# Patient Record
Sex: Male | Born: 1975 | Race: White | Hispanic: No | Marital: Single | Smoking: Current every day smoker
Health system: Southern US, Community
[De-identification: ages and names within clinical notes are randomized; demographics above are authoritative.]

## PROBLEM LIST (undated history)

## (undated) HISTORY — PX: HERNIA REPAIR: SHX51

## (undated) HISTORY — PX: BACK SURGERY: SHX140

---

## 2007-09-09 ENCOUNTER — Emergency Department: Payer: Self-pay | Admitting: Internal Medicine

## 2009-07-19 IMAGING — CR DG FOOT COMPLETE 3+V*L*
1 series · 3 of 3 positions shown · non-contrast
Comparison: none

REASON FOR EXAM: injury
COMMENTS:   LMP: (Male)

PROCEDURE:     DXR - DXR FOOT LT COMP W/OBLIQUES  - September 09, 2007  [DATE]
RESULT:     No fracture, dislocation or other acute bony abnormality is
identified.

[Series 1: view not recorded · 0.17mm/px · 3 of 3 slices shown]
[im 1/3]
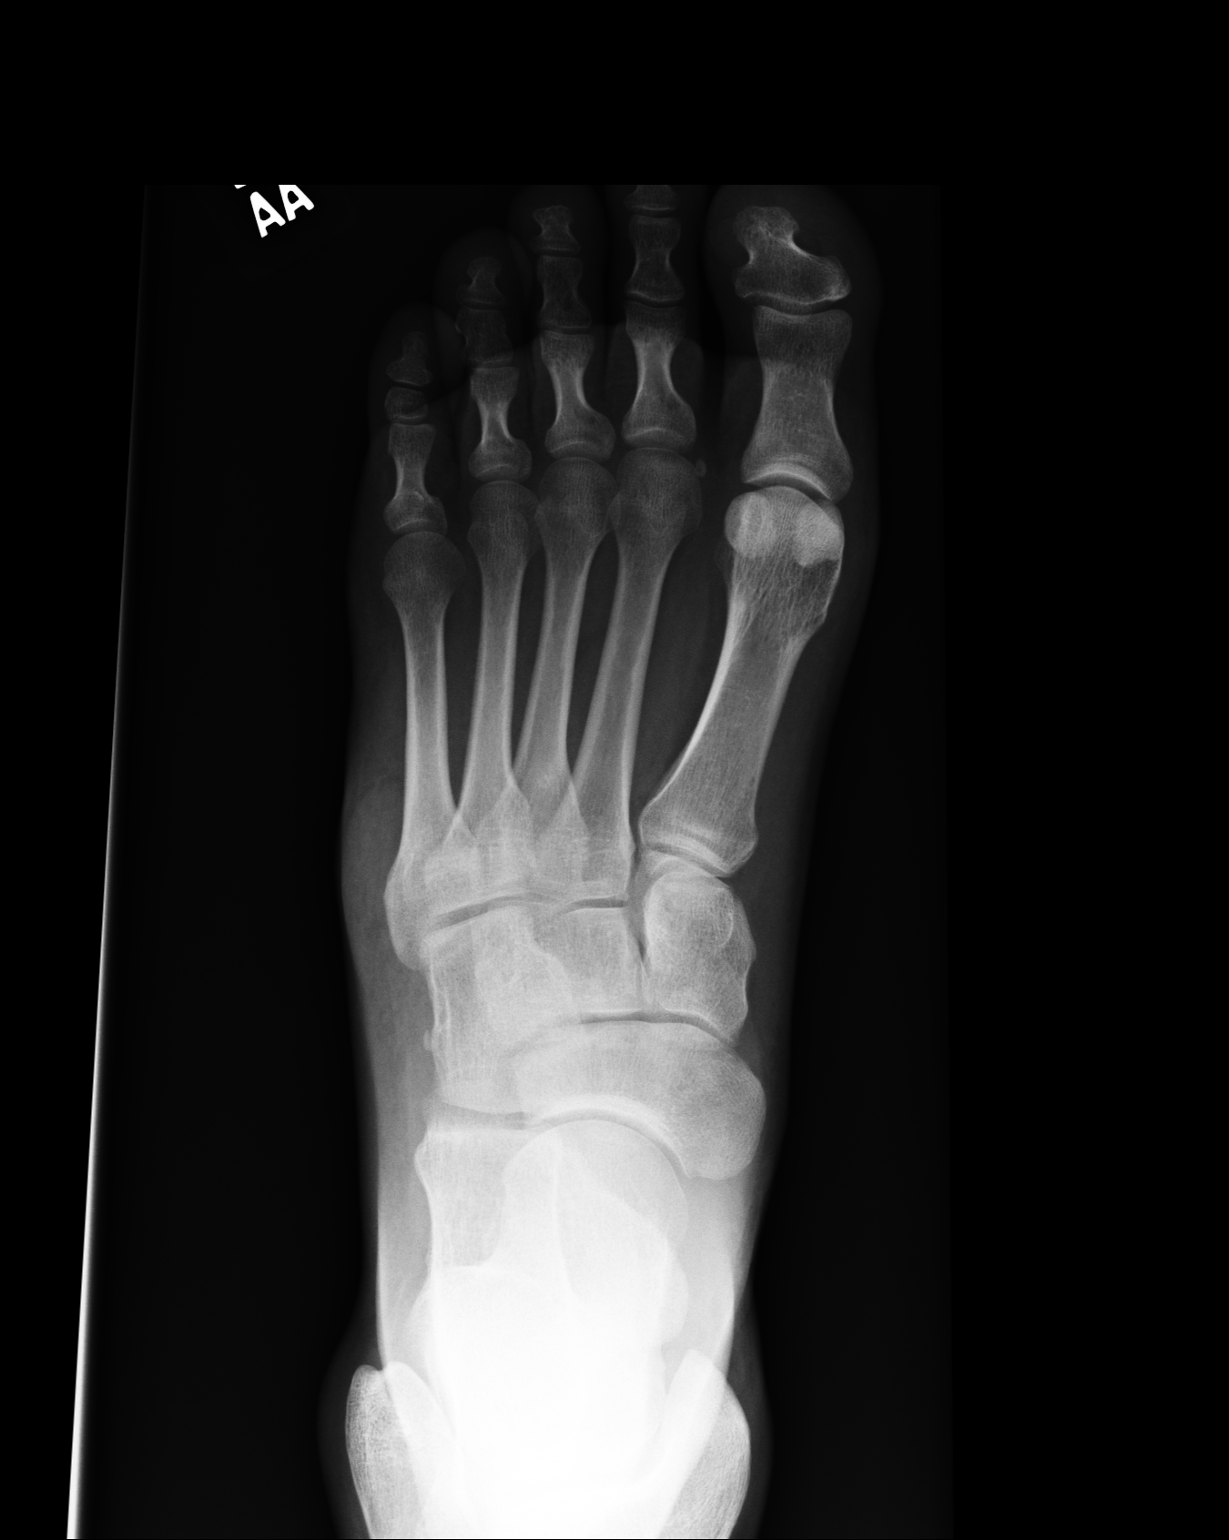
[im 2/3]
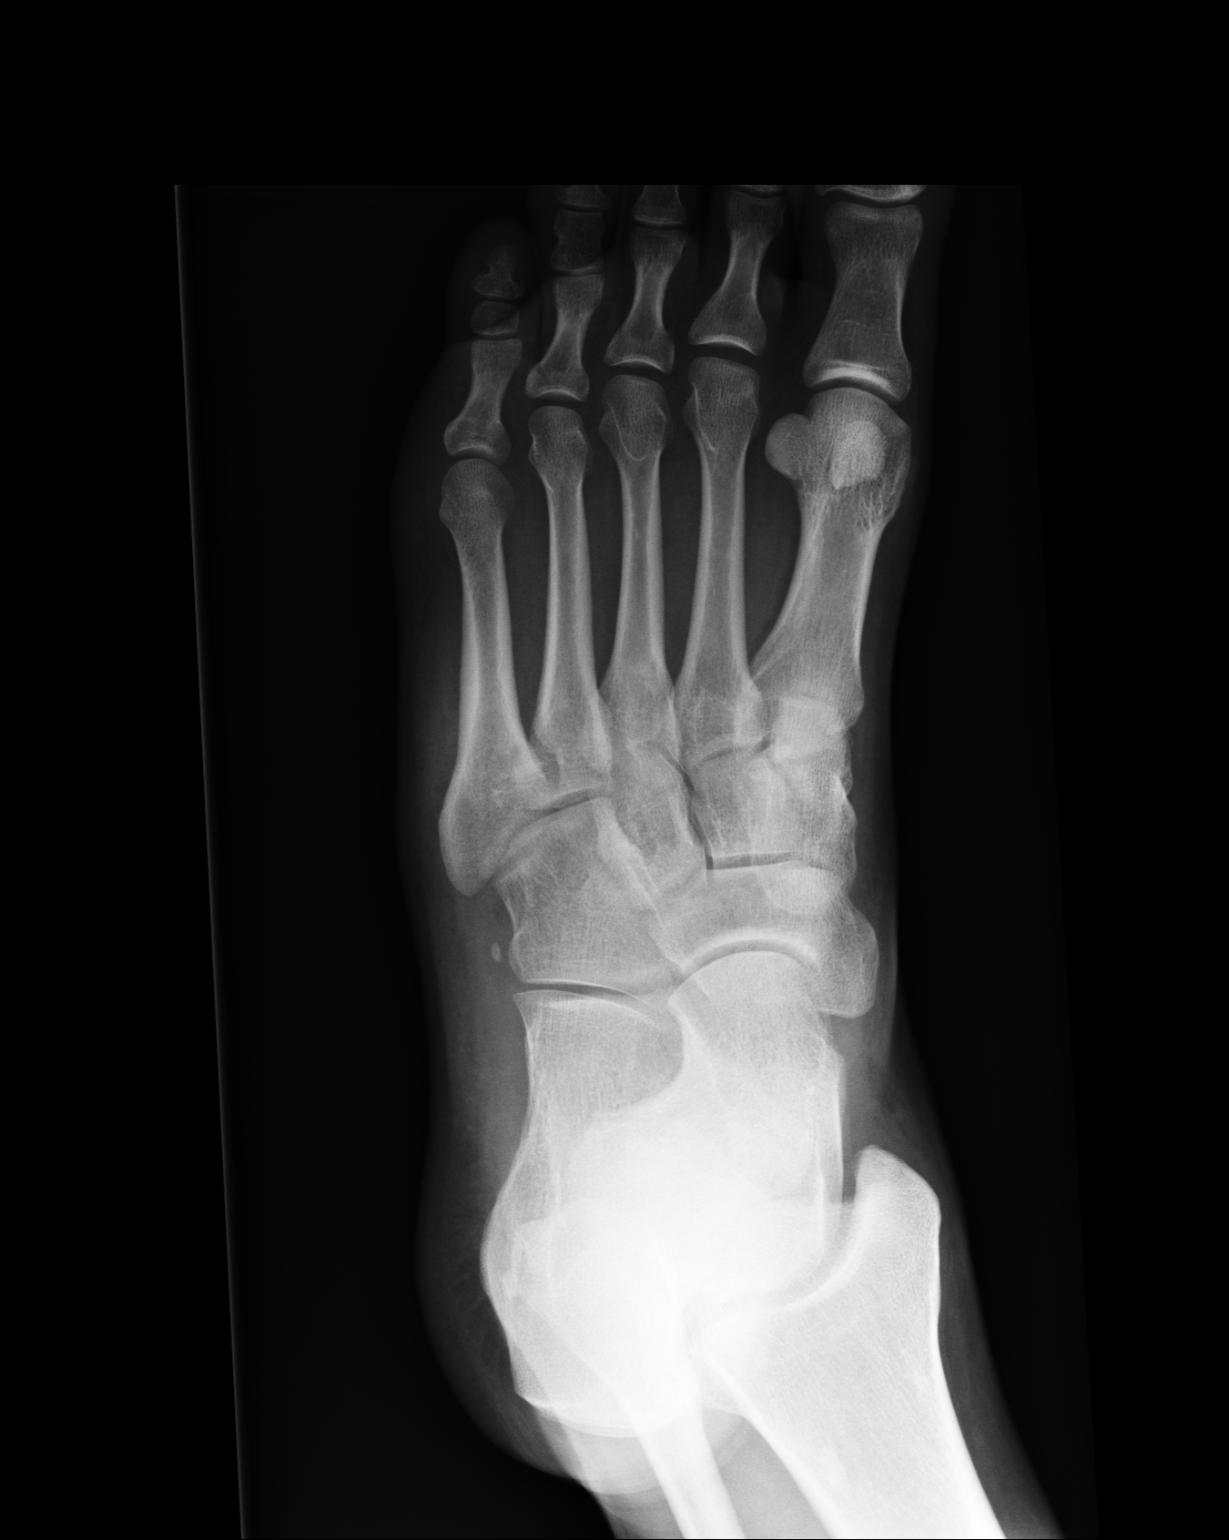
[im 3/3]
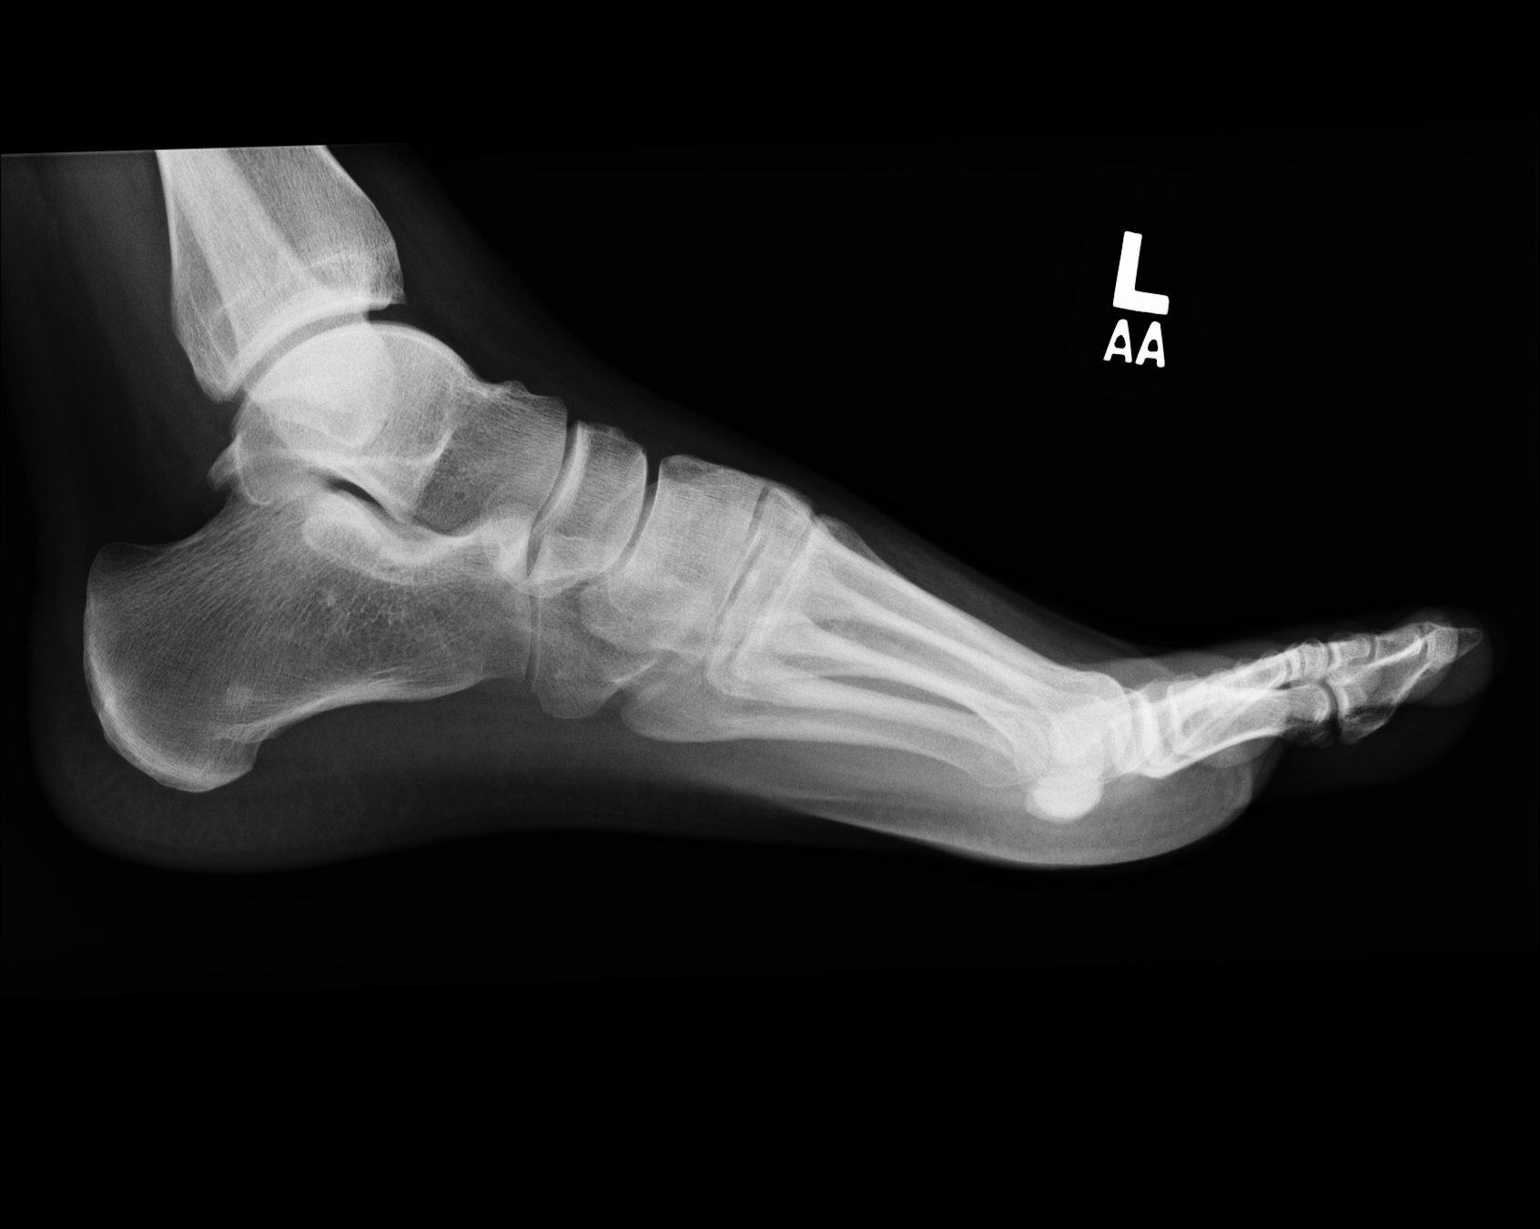

[3 of 3 positions shown; findings below may reference images not displayed]

IMPRESSION: 1.     No significant abnormalities are noted.

## 2014-07-09 ENCOUNTER — Emergency Department
Admit: 2014-07-09 | Disposition: A | Discharge status: Home / Self Care | Payer: Self-pay | Admitting: Emergency Medicine

## 2014-07-09 LAB — CBC WITH DIFFERENTIAL/PLATELET
Basophil #: 0.1 10*3/uL (ref 0.0–0.1)
Basophil %: 0.6 %
Eosinophil #: 0.4 10*3/uL (ref 0.0–0.7)
Eosinophil %: 2.1 %
HCT: 47.3 % (ref 40.0–52.0)
HGB: 16.1 g/dL (ref 13.0–18.0)
Lymphocyte #: 3.1 10*3/uL (ref 1.0–3.6)
Lymphocyte %: 18.1 %
MCH: 32.5 pg (ref 26.0–34.0)
MCHC: 33.9 g/dL (ref 32.0–36.0)
MCV: 96 fL (ref 80–100)
MONOS PCT: 7.4 %
Monocyte #: 1.3 x10 3/mm — ABNORMAL HIGH (ref 0.2–1.0)
NEUTROS ABS: 12.1 10*3/uL — AB (ref 1.4–6.5)
Neutrophil %: 71.8 %
Platelet: 251 10*3/uL (ref 150–440)
RBC: 4.94 10*6/uL (ref 4.40–5.90)
RDW: 13 % (ref 11.5–14.5)
WBC: 16.9 10*3/uL — ABNORMAL HIGH (ref 3.8–10.6)

## 2014-07-09 LAB — URINALYSIS, COMPLETE
BACTERIA: NONE SEEN
Bilirubin,UR: NEGATIVE
Blood: NEGATIVE
GLUCOSE, UR: NEGATIVE mg/dL (ref 0–75)
Ketone: NEGATIVE
Leukocyte Esterase: NEGATIVE
NITRITE: NEGATIVE
Ph: 7 (ref 4.5–8.0)
Protein: NEGATIVE
RBC,UR: NONE SEEN /HPF (ref 0–5)
SPECIFIC GRAVITY: 1.003 (ref 1.003–1.030)
Squamous Epithelial: NONE SEEN

## 2014-07-09 LAB — BASIC METABOLIC PANEL
Anion Gap: 8 (ref 7–16)
BUN: 11 mg/dL
CHLORIDE: 105 mmol/L
CO2: 27 mmol/L
CREATININE: 0.9 mg/dL
Calcium, Total: 9.5 mg/dL
GLUCOSE: 116 mg/dL — AB
Potassium: 3.5 mmol/L
SODIUM: 140 mmol/L

## 2014-07-11 LAB — URINE CULTURE

## 2016-05-18 IMAGING — US US SCROTUM W/ DOPPLER COMPLETE
1 series · 13 of 25 positions shown · non-contrast
Comparison: None.

CLINICAL DATA: 38-year-old male with 18 hr history of left
testicular pain, redness and edema. Of note, patient had a tick on
his left testicle yesterday.

EXAM:
SCROTAL ULTRASOUND
DOPPLER ULTRASOUND OF THE TESTICLES
TECHNIQUE: Complete ultrasound examination of the testicles, epididymis, and
other scrotal structures was performed. Color and spectral Doppler
ultrasound were also utilized to evaluate blood flow to the
testicles.

[Series 1: us scrotum w/ doppler complete · 0.07mm/px · 13 of 131 slices shown]
[im 1/131]
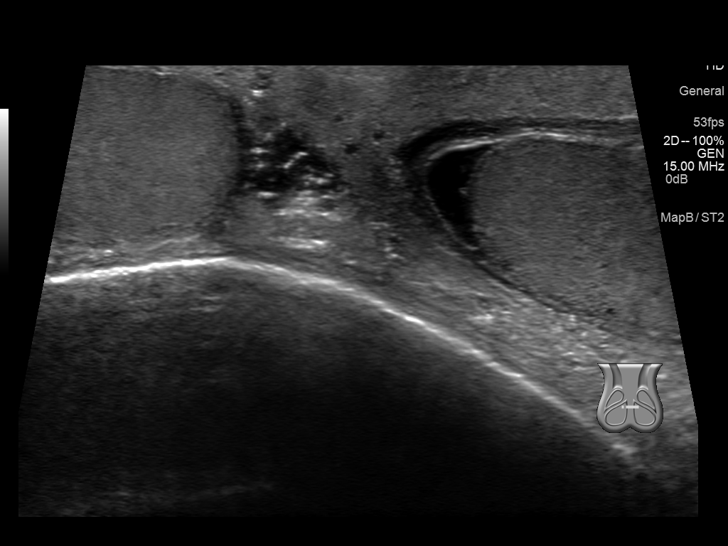
[im 11/131]
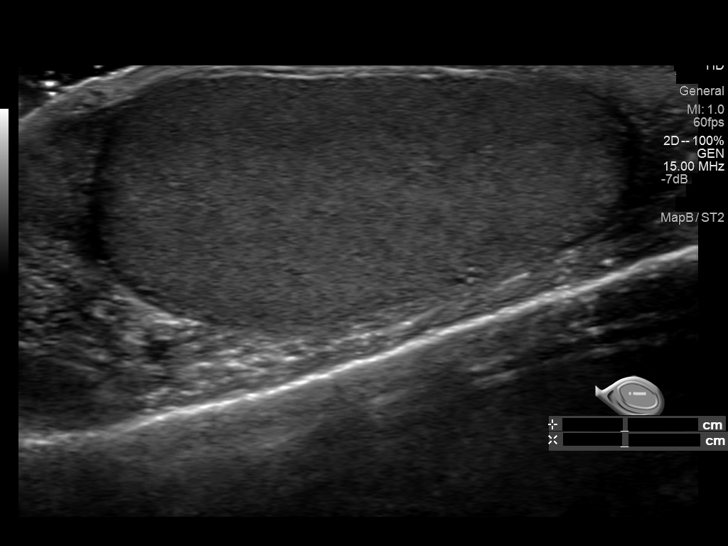
[im 22/131]
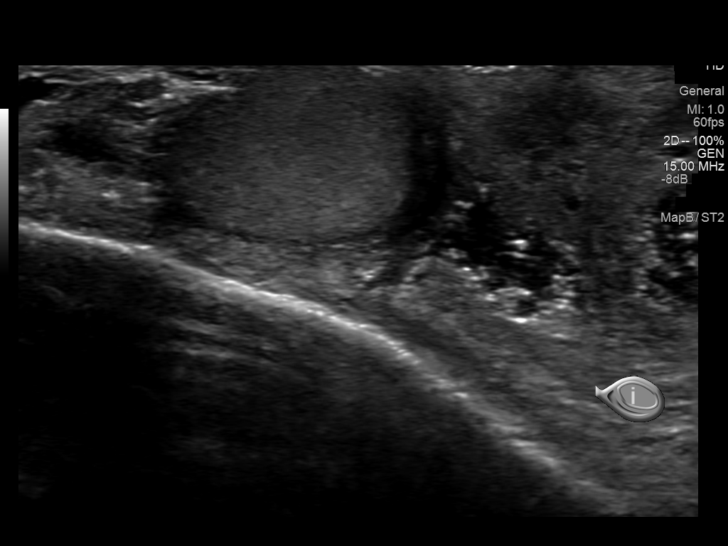
[im 33/131]
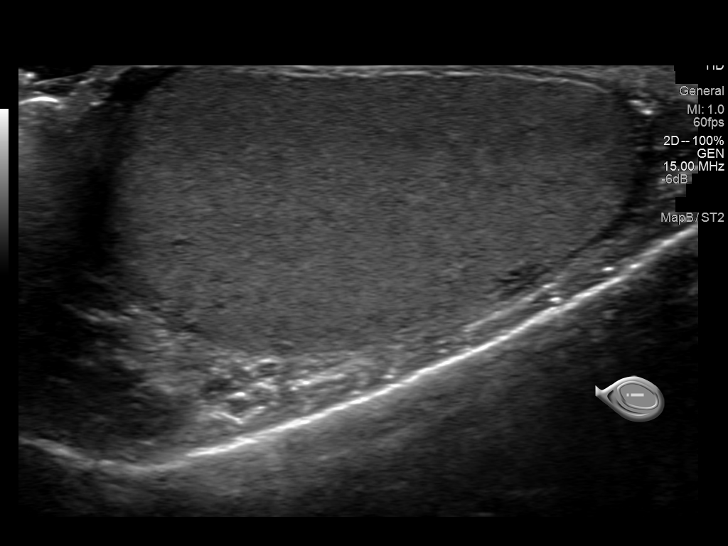
[im 44/131]
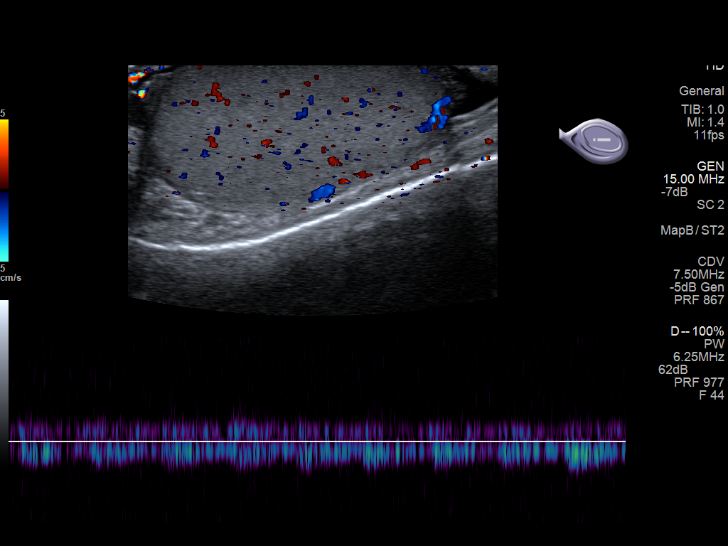
[im 55/131]
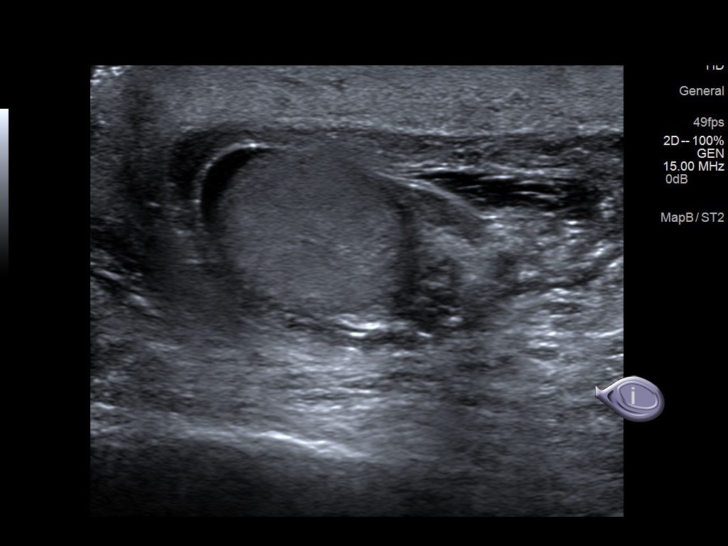
[im 66/131]
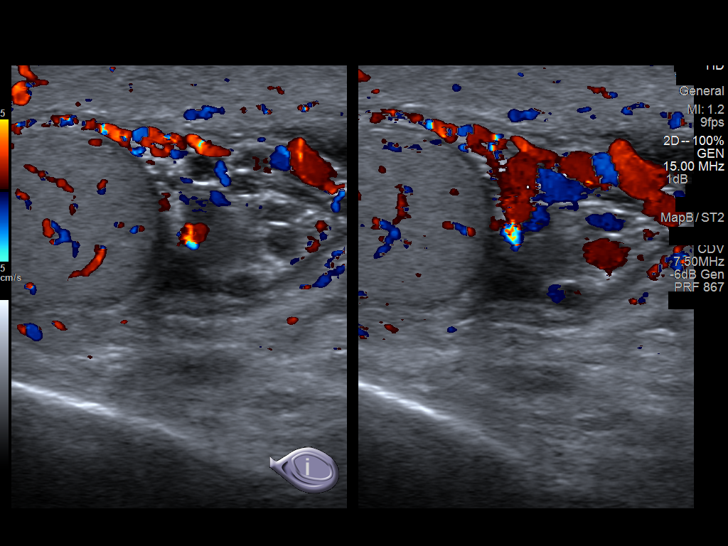
[im 76/131]
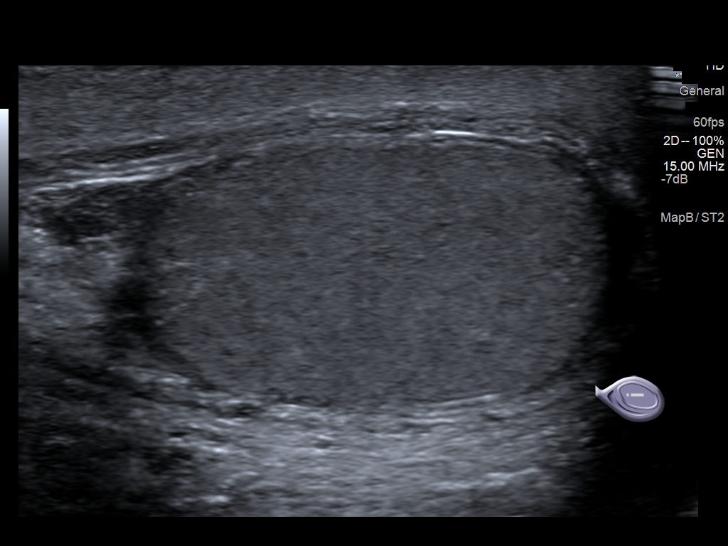
[im 87/131]
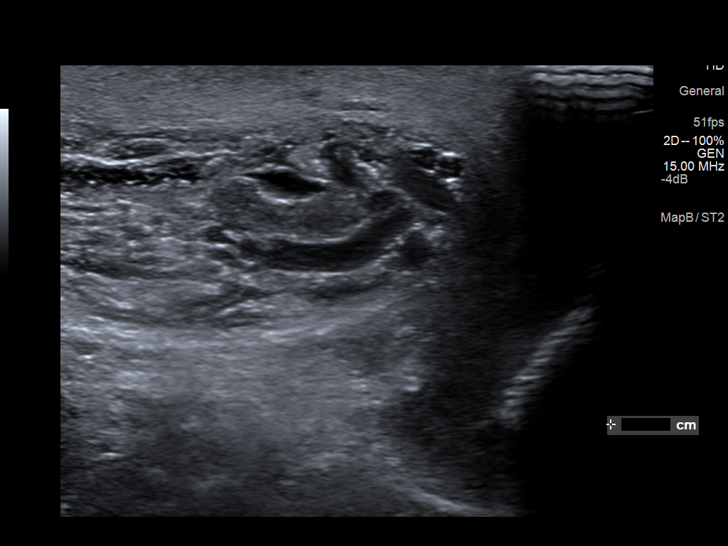
[im 98/131]
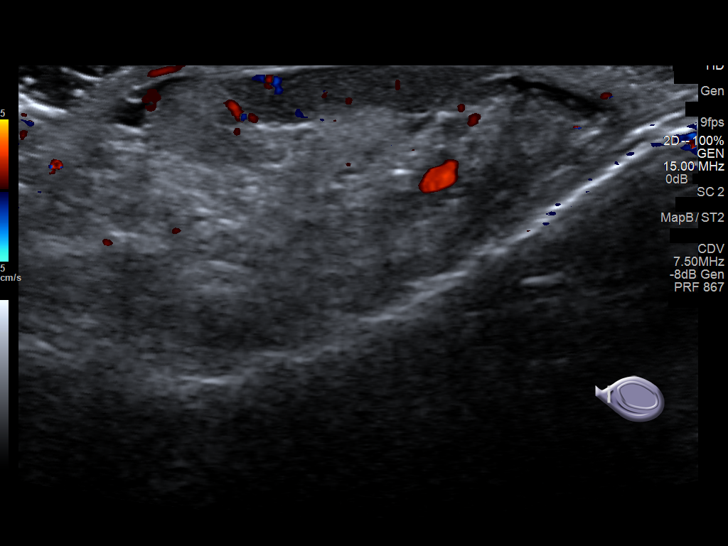
[im 109/131]
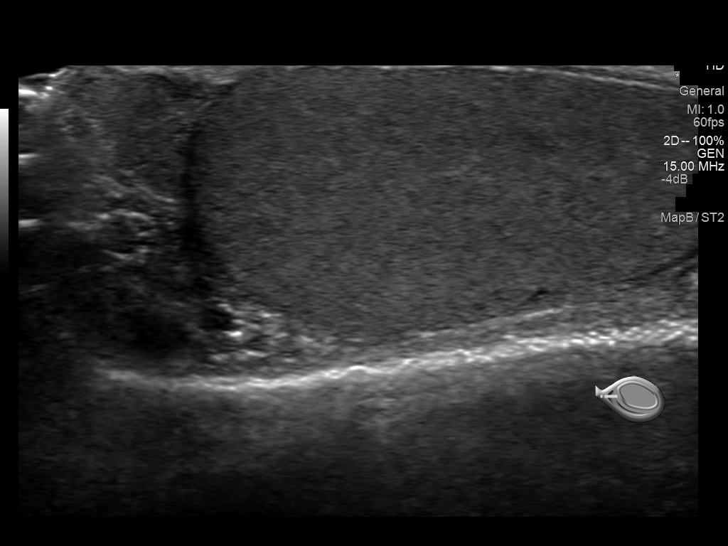
[im 120/131]
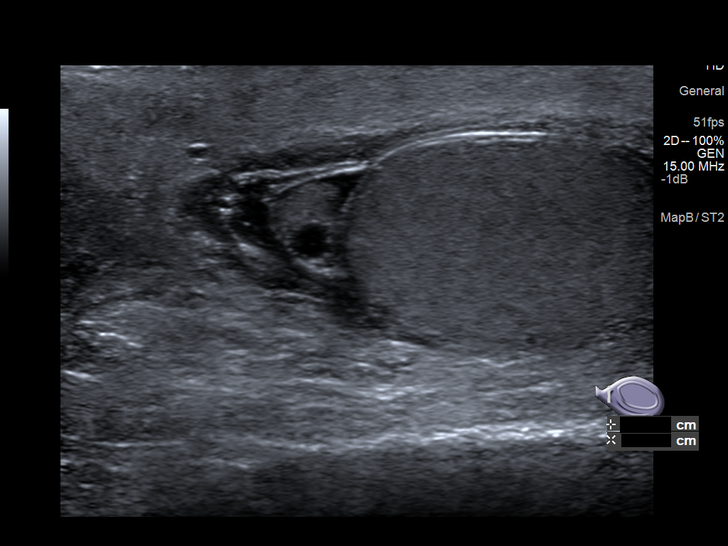
[im 131/131]
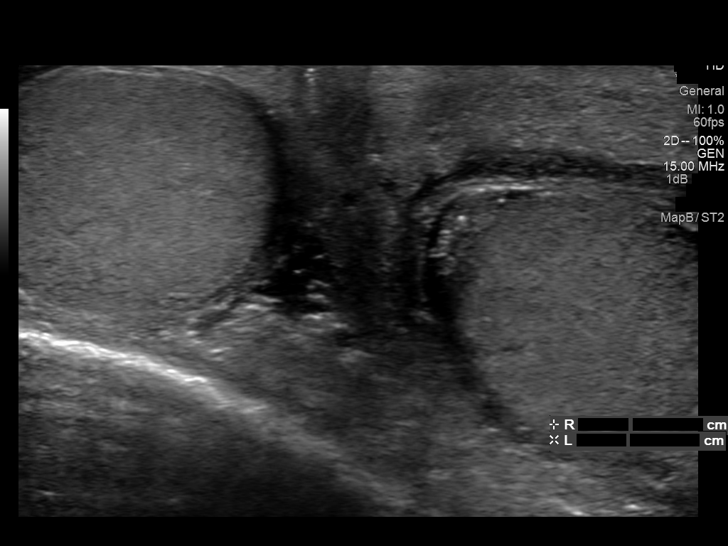

[13 of 25 positions shown; findings below may reference images not displayed]

FINDINGS: Right testicle

Measurements: 3.9 x 1.9 x 2.8 cm. No mass or microlithiasis
visualized.

Left testicle

Measurements: 3.8 x 1.8 x 2.8 cm. No mass or microlithiasis
visualized.

Right epididymis:  4 mm epididymal cyst versus spermatocele.

Left epididymis:  4 mm epididymal cyst versus spermatocele.

Hydrocele:  Small sonographically simple left hydrocele.

Varicocele:  Small left-sided varicocele.

Pulsed Doppler interrogation of both testes demonstrates normal low
resistance arterial and venous waveforms bilaterally.
IMPRESSION: 1. No evidence of testicular torsion.
2. Small and sonographically simple left-sided hydrocele may be
reactive.
3. Small left varicocele.
4. Incidental note is made of bilateral 4 mm epididymal cysts versus
spermatoceles. These benign findings require no additional imaging
followup.

## 2018-11-10 ENCOUNTER — Other Ambulatory Visit: Payer: Self-pay | Admitting: Internal Medicine

## 2018-11-10 DIAGNOSIS — Z20822 Contact with and (suspected) exposure to covid-19: Secondary | ICD-10-CM

## 2018-11-11 LAB — NOVEL CORONAVIRUS, NAA: SARS-CoV-2, NAA: NOT DETECTED

## 2019-12-15 ENCOUNTER — Other Ambulatory Visit: Payer: Self-pay

## 2019-12-15 DIAGNOSIS — Z20822 Contact with and (suspected) exposure to covid-19: Secondary | ICD-10-CM

## 2019-12-16 LAB — NOVEL CORONAVIRUS, NAA: SARS-CoV-2, NAA: NOT DETECTED

## 2019-12-16 LAB — SARS-COV-2, NAA 2 DAY TAT

## 2020-12-02 ENCOUNTER — Other Ambulatory Visit: Payer: Self-pay

## 2020-12-02 DIAGNOSIS — Z28310 Unvaccinated for covid-19: Secondary | ICD-10-CM | POA: Insufficient documentation

## 2020-12-02 DIAGNOSIS — F17210 Nicotine dependence, cigarettes, uncomplicated: Secondary | ICD-10-CM | POA: Insufficient documentation

## 2020-12-02 DIAGNOSIS — U071 COVID-19: Secondary | ICD-10-CM | POA: Insufficient documentation

## 2020-12-02 DIAGNOSIS — R1084 Generalized abdominal pain: Secondary | ICD-10-CM | POA: Insufficient documentation

## 2020-12-02 DIAGNOSIS — Z79899 Other long term (current) drug therapy: Secondary | ICD-10-CM | POA: Insufficient documentation

## 2020-12-02 LAB — COMPREHENSIVE METABOLIC PANEL
ALT: 26 U/L (ref 0–44)
AST: 40 U/L (ref 15–41)
Albumin: 4.2 g/dL (ref 3.5–5.0)
Alkaline Phosphatase: 88 U/L (ref 38–126)
Anion gap: 11 (ref 5–15)
BUN: 19 mg/dL (ref 6–20)
CO2: 24 mmol/L (ref 22–32)
Calcium: 9.3 mg/dL (ref 8.9–10.3)
Chloride: 98 mmol/L (ref 98–111)
Creatinine, Ser: 0.96 mg/dL (ref 0.61–1.24)
GFR, Estimated: >60 mL/min (ref >60)
Glucose, Bld: 134 mg/dL — ABNORMAL HIGH (ref 70–99)
Potassium: 3.3 mmol/L — ABNORMAL LOW (ref 3.5–5.1)
Sodium: 133 mmol/L — ABNORMAL LOW (ref 135–145)
Total Bilirubin: 0.6 mg/dL (ref 0.3–1.2)
Total Protein: 7.2 g/dL (ref 6.5–8.1)

## 2020-12-02 LAB — LACTIC ACID, PLASMA
Lactic Acid, Venous: 2.7 mmol/L (ref 0.5–1.9)
Lactic Acid, Venous: 2.4 mmol/L (ref 0.5–1.9)

## 2020-12-02 LAB — CBC WITH DIFFERENTIAL/PLATELET
Abs Immature Granulocytes: 0.02 10*3/uL (ref 0.00–0.07)
Basophils Absolute: 0 10*3/uL (ref 0.0–0.1)
Basophils Relative: 0 %
Eosinophils Absolute: 0 10*3/uL (ref 0.0–0.5)
Eosinophils Relative: 0 %
HCT: 47.8 % (ref 39.0–52.0)
Hemoglobin: 17 g/dL (ref 13.0–17.0)
Immature Granulocytes: 0 %
Lymphocytes Relative: 22 %
Lymphs Abs: 1.2 10*3/uL (ref 0.7–4.0)
MCH: 32.6 pg (ref 26.0–34.0)
MCHC: 35.6 g/dL (ref 30.0–36.0)
MCV: 91.6 fL (ref 80.0–100.0)
Monocytes Absolute: 0.4 10*3/uL (ref 0.1–1.0)
Monocytes Relative: 7 %
Neutro Abs: 3.8 10*3/uL (ref 1.7–7.7)
Neutrophils Relative %: 71 %
Platelets: 188 10*3/uL (ref 150–400)
RBC: 5.22 MIL/uL (ref 4.22–5.81)
RDW: 13 % (ref 11.5–15.5)
WBC: 5.3 10*3/uL (ref 4.0–10.5)
nRBC: 0 % (ref 0.0–0.2)

## 2020-12-02 LAB — CK: Total CK: 91 U/L (ref 49–397)

## 2020-12-02 LAB — ETHANOL: Alcohol, Ethyl (B): <10 mg/dL (ref <10)

## 2020-12-02 NOTE — ED Provider Notes (Signed)
Emergency Medicine Provider Triage Evaluation Note  Maurice Brown , a 45 y.o. male  was evaluated in triage.  Pt complains of "infection in my brain," left shoulder pain, back pain, and abdominal pain. Symptoms started Friday. Reports he hasn't eaten in 3 days and laid in the floor from Friday until today.  Review of Systems  Positive: Diarrhea, musculoskeletal pain Negative: Headache, vomiting, focal weakness.  Physical Exam  BP 115/78 (BP Location: Right Arm)   Pulse (!) 127   Temp (!) 100.9 F (38.3 C) (Oral)   Resp 14   SpO2 96%  Gen:   Awake, no distress   Resp:  Normal effort  MSK:   Moves extremities without difficulty  Other:  Slow, but clear speech. No focal weakness  Medical Decision Making  Medically screening exam initiated at 7:21 PM.  Appropriate orders placed.  Maurice Brown was informed that the remainder of the evaluation will be completed by another provider, this initial triage assessment does not replace that evaluation, and the importance of remaining in the ED until their evaluation is complete.   Chinita Pester, FNP 12/02/20 1925    Gilles Chiquito, MD 12/02/20 1944

## 2020-12-02 NOTE — ED Notes (Signed)
COVID SWAB SENT TO LAB

## 2020-12-02 NOTE — ED Notes (Signed)
Ex blue in lab

## 2020-12-02 NOTE — ED Triage Notes (Addendum)
Pt states 'i woke up fri and couldn't move, I had back pain (broken 68yrs ago with surgery), my L shoulder hurt (dislocated 54mo ago-repaired), and my R groin hurt (inguinal hernia repair at 45yo). I also havent eaten and have had diarrhea x 3 days, I was hospitalized in [place] x1day for shingles and put on acyclovir last month.  I know I have an infection in my brain.'  Pt ambulatory in no apparent distress.

## 2020-12-03 ENCOUNTER — Emergency Department
Admission: EM | Admit: 2020-12-03 | Discharge: 2020-12-03 | Disposition: A | Discharge status: Home / Self Care | Payer: Self-pay | Attending: Emergency Medicine | Admitting: Emergency Medicine

## 2020-12-03 ENCOUNTER — Emergency Department: Payer: Self-pay

## 2020-12-03 ENCOUNTER — Encounter: Payer: Self-pay | Admitting: Emergency Medicine

## 2020-12-03 DIAGNOSIS — U071 COVID-19: Secondary | ICD-10-CM

## 2020-12-03 LAB — URINALYSIS, COMPLETE (UACMP) WITH MICROSCOPIC
Bacteria, UA: NONE SEEN
Bilirubin Urine: NEGATIVE
Glucose, UA: NEGATIVE mg/dL
Hgb urine dipstick: NEGATIVE
Ketones, ur: NEGATIVE mg/dL
Leukocytes,Ua: NEGATIVE
Nitrite: NEGATIVE
Protein, ur: NEGATIVE mg/dL
Specific Gravity, Urine: <1.005 — ABNORMAL LOW (ref 1.005–1.030)
Squamous Epithelial / LPF: NONE SEEN (ref 0–5)
pH: 6 (ref 5.0–8.0)

## 2020-12-03 LAB — URINE DRUG SCREEN, QUALITATIVE (ARMC ONLY)
Amphetamines, Ur Screen: POSITIVE — AB
Barbiturates, Ur Screen: NOT DETECTED
Benzodiazepine, Ur Scrn: NOT DETECTED
Cannabinoid 50 Ng, Ur ~~LOC~~: NOT DETECTED
Cocaine Metabolite,Ur ~~LOC~~: NOT DETECTED
MDMA (Ecstasy)Ur Screen: NOT DETECTED
Methadone Scn, Ur: NOT DETECTED
Opiate, Ur Screen: NOT DETECTED
Phencyclidine (PCP) Ur S: NOT DETECTED
Tricyclic, Ur Screen: NOT DETECTED

## 2020-12-03 LAB — LACTIC ACID, PLASMA: Lactic Acid, Venous: 1.7 mmol/L (ref 0.5–1.9)

## 2020-12-03 LAB — RESP PANEL BY RT-PCR (FLU A&B, COVID) ARPGX2
Influenza A by PCR: NEGATIVE
Influenza B by PCR: NEGATIVE
SARS Coronavirus 2 by RT PCR: POSITIVE — AB

## 2020-12-03 LAB — TROPONIN I (HIGH SENSITIVITY): Troponin I (High Sensitivity): 8 ng/L (ref <18)

## 2020-12-03 LAB — PROCALCITONIN: Procalcitonin: <0.1 ng/mL

## 2020-12-03 MED ORDER — IOHEXOL 350 MG/ML SOLN
75.0000 mL | Freq: Once | INTRAVENOUS | Status: AC | PRN
  Administered 2020-12-03: 75 mL via INTRAVENOUS

## 2020-12-03 MED ORDER — ACETAMINOPHEN 500 MG PO TABS
1000.0000 mg | ORAL_TABLET | Freq: Once | ORAL | Status: AC
Start: 2020-12-03 — End: 2020-12-03
  Administered 2020-12-03: 1000 mg via ORAL
  Filled 2020-12-03: qty 2

## 2020-12-03 MED ORDER — KETOROLAC TROMETHAMINE 30 MG/ML IJ SOLN
30.0000 mg | Freq: Once | INTRAMUSCULAR | Status: AC
  Administered 2020-12-03: 30 mg via INTRAVENOUS
  Filled 2020-12-03: qty 1

## 2020-12-03 MED ORDER — LACTATED RINGERS IV BOLUS
2500.0000 mL | Freq: Once | INTRAVENOUS | Status: AC
  Administered 2020-12-03: 2500 mL via INTRAVENOUS

## 2020-12-03 MED ORDER — METOCLOPRAMIDE HCL 5 MG/ML IJ SOLN
10.0000 mg | Freq: Once | INTRAMUSCULAR | Status: DC
  Filled 2020-12-03: qty 2

## 2020-12-03 MED ORDER — NIRMATRELVIR/RITONAVIR (PAXLOVID)TABLET: 3.0000 | ORAL_TABLET | Freq: Two times a day (BID) | ORAL | 0 refills | Status: AC

## 2020-12-03 MED ORDER — DIPHENHYDRAMINE HCL 50 MG/ML IJ SOLN
25.0000 mg | Freq: Once | INTRAMUSCULAR | Status: DC
  Filled 2020-12-03: qty 1

## 2020-12-03 NOTE — Discharge Instructions (Addendum)
You may alternate Tylenol 1000 mg every 6 hours as needed for pain, fever and Ibuprofen 800 mg every 8 hours as needed for pain, fever.  Please take Ibuprofen with food.  Do not take more than 4000 mg of Tylenol (acetaminophen) in a 24 hour period.  These medications are found over-the-counter.   Steps to find a Primary Care Provider (PCP):  Call [phone] or [phone] to access "[facility] Find a Doctor Service."  2.  You may also go on the [facility] website at [email or web address]  

## 2020-12-03 NOTE — ED Notes (Signed)
This RN called for patient in the lobby multiple times with no answer, called patient's cell phone several times with no response.

## 2020-12-03 NOTE — ED Provider Notes (Signed)
[facility] Emergency Department Provider Note  ____________________________________________   Event Date/Time   First MD Initiated Contact with Patient 12/03/20 0116     (approximate)  I have reviewed the triage vital signs and the nursing notes.   HISTORY  Chief Complaint Weakness    HPI Maurice Brown is a 45 y.o. male with no significant past medical history who presents to the emergency department with complaints of throbbing headache, fever, body aches, diarrhea, abdominal pain, shortness of breath started Friday, September 16.  Patient has not vaccinated against COVID-19.  No known sick contacts or recent travel.  No neck pain or neck stiffness.  No nausea or vomiting.  No dysuria or hematuria.  No rash.  States he is having a severe headache and is concerned that he has "infection in my brain".  He has not taken any medications prior to arrival.  States he has felt so bad he has been laying on the bathroom floor for several days as he felt "paralyzed" and could not get up because he was feeling so poorly.  States he has not been eating and drinking.        History reviewed. No pertinent past medical history.  There are no problems to display for this patient.   Past Surgical History:  Procedure Laterality Date   BACK SURGERY     HERNIA REPAIR      Prior to Admission medications   Medication Sig Start Date End Date Taking? Authorizing Provider  acyclovir (ZOVIRAX) 400 MG tablet Take 400 mg by mouth 5 (five) times daily. Filled 10/30/2020   Yes [provider]  nirmatrelvir/ritonavir EUA (PAXLOVID) 20 x 150 MG & 10 x 100MG  TABS Take 3 tablets by mouth 2 (two) times daily for 5 days. Patient GFR is 60. Take nirmatrelvir (150 mg) two tablets twice daily for 5 days and ritonavir (100 mg) one tablet twice daily for 5 days. 12/03/20 12/08/20 Yes Nickoles Gregori, 12/10/20, DO    Allergies Patient has no known allergies.  History reviewed. No pertinent  family history.  Social History Social History   Tobacco Use   Smoking status: Every Day    Types: Cigarettes   Smokeless tobacco: Never  Substance Use Topics   Alcohol use: Not Currently   Drug use: Not Currently    Comment: hx marijuana use    Review of Systems Constitutional: + fever. Eyes: No visual changes. ENT: No sore throat. Cardiovascular: Denies chest pain. Respiratory: + shortness of breath. Gastrointestinal: No nausea, vomiting.  + diarrhea. Genitourinary: Negative for dysuria. Musculoskeletal: Negative for back pain. Skin: Negative for rash. Neurological: Negative for focal weakness or numbness.  ____________________________________________   PHYSICAL EXAM:  VITAL SIGNS: ED Triage Vitals  Enc Vitals Group     BP 12/02/20 1915 115/78     Pulse Rate 12/02/20 1915 (!) 127     Resp 12/02/20 1915 14     Temp 12/02/20 1915 (!) 100.9 F (38.3 C)     Temp Source 12/02/20 1915 Oral     SpO2 12/02/20 1915 96 %     Weight 12/02/20 1922 165 lb (74.8 kg)     Height 12/02/20 1922 5\' 10"  (1.778 m)     Head Circumference --      Peak Flow --      Pain Score 12/02/20 1922 10     Pain Loc --      Pain Edu? --      Excl. in GC? --  CONSTITUTIONAL: Alert and oriented and responds appropriately to questions.  Disheveled, chronically ill-appearing, appears older than stated age, nontoxic HEAD: Normocephalic, atraumatic EYES: Conjunctivae clear, pupils appear equal, EOM appear intact ENT: normal nose; moist mucous membranes NECK: Supple, normal ROM, no meningismus CARD: RRR; S1 and S2 appreciated; no murmurs, no clicks, no rubs, no gallops RESP: Normal chest excursion without splinting or tachypnea; breath sounds clear and equal bilaterally; no wheezes, no rhonchi, no rales, no hypoxia or respiratory distress, speaking full sentences ABD/GI: Normal bowel sounds; non-distended; soft, diffusely tender throughout the abdomen with voluntary guarding, no rebound BACK:  The back appears normal, no CVA tenderness EXT: Normal ROM in all joints; no deformity noted, no edema; no cyanosis SKIN: Normal color for age and race; warm; no rash on exposed skin NEURO: Moves all extremities equally, ambulates with normal gait, normal speech PSYCH: The patient's mood and manner are appropriate.  ____________________________________________   LABS (all labs ordered are listed, but only abnormal results are displayed)  Labs Reviewed  RESP PANEL BY RT-PCR (FLU A&B, COVID) ARPGX2 - Abnormal; Notable for the following components:      Result Value   SARS Coronavirus 2 by RT PCR POSITIVE (*)    All other components within normal limits  COMPREHENSIVE METABOLIC PANEL - Abnormal; Notable for the following components:   Sodium 133 (*)    Potassium 3.3 (*)    Glucose, Bld 134 (*)    All other components within normal limits  URINALYSIS, COMPLETE (UACMP) WITH MICROSCOPIC - Abnormal; Notable for the following components:   Color, Urine STRAW (*)    Specific Gravity, Urine <1.005 (*)    All other components within normal limits  LACTIC ACID, PLASMA - Abnormal; Notable for the following components:   Lactic Acid, Venous 2.7 (*)    All other components within normal limits  LACTIC ACID, PLASMA - Abnormal; Notable for the following components:   Lactic Acid, Venous 2.4 (*)    All other components within normal limits  URINE DRUG SCREEN, QUALITATIVE ([facility] ONLY) - Abnormal; Notable for the following components:   Amphetamines, Ur Screen POSITIVE (*)    All other components within normal limits  CULTURE, BLOOD (SINGLE)  CULTURE, BLOOD (SINGLE)  URINE CULTURE  CBC WITH DIFFERENTIAL/PLATELET  CK  ETHANOL  PROCALCITONIN  LACTIC ACID, PLASMA  TROPONIN I (HIGH SENSITIVITY)   ____________________________________________  EKG   EKG Interpretation  Date/Time:  Monday December 03 2020 01:51:50 EDT Ventricular Rate:  71 PR Interval:  148 QRS Duration: 82 QT  Interval:  382 QTC Calculation: 416 R Axis:   84 Text Interpretation: Sinus rhythm Probable anteroseptal infarct, old ST elevation, consider inferior injury Confirmed by Rochele Raring [phone]) on 12/03/2020 2:05:49 AM        ____________________________________________  RADIOLOGY Normajean Baxter Quintasia Theroux, personally viewed and evaluated these images (plain radiographs) as part of my medical decision making, as well as reviewing the written report by the radiologist.  ED MD interpretation: CT head shows no acute abnormality.  CT of the abdomen pelvis shows no acute abnormality.  Appendix is normal.  Chest x-ray clear.  Official radiology report(s): CT HEAD WO CONTRAST ( )  Result Date: 12/03/2020 CLINICAL DATA:  Headache, intracranial hemorrhage suspected EXAM: CT HEAD WITHOUT CONTRAST TECHNIQUE: Contiguous axial images were obtained from the base of the skull through the vertex without intravenous contrast. COMPARISON:  None. FINDINGS: Brain: No evidence of acute infarction, hemorrhage, cerebral edema, mass, mass effect, or midline shift. Ventricles and sulci are  normal for age. No extra-axial fluid collection. Vascular: No hyperdense vessel or unexpected calcification. Skull: Normal. Negative for fracture or focal lesion. Sinuses/Orbits: Mucosal thickening in the ethmoid air cells. The orbits are unremarkable. Other: The mastoid air cells are well aerated. IMPRESSION: No acute intracranial process. No etiology is seen for the patient's headache. Electronically Signed   By: Wiliam Ke M.D.   On: 12/03/2020 02:43   CT ABDOMEN PELVIS W CONTRAST  Result Date: 12/03/2020 CLINICAL DATA:  Abdominal pain, acute, non localized. EXAM: CT ABDOMEN AND PELVIS WITH CONTRAST TECHNIQUE: Multidetector CT imaging of the abdomen and pelvis was performed using the standard protocol following bolus administration of intravenous contrast. CONTRAST:  49mL OMNIPAQUE IOHEXOL 350 MG/ML SOLN COMPARISON:  None. FINDINGS:  Lower chest: Bibasilar atelectasis.  Otherwise negative. Hepatobiliary: No focal liver abnormality is seen. No gallstones, gallbladder wall thickening, or biliary dilatation. Pancreas: Unremarkable. No pancreatic ductal dilatation or surrounding inflammatory changes. Spleen: Normal in size without focal abnormality. Adrenals/Urinary Tract: The adrenal glands are unremarkable. The kidneys enhance symmetrically with no hydronephrosis. The bladder is unremarkable for degree of distention. Stomach/Bowel: Enteric contrast is seen to the distal small bowel. The stomach is within normal limits. No evidence of bowel obstruction or wall thickening. The appendix is unremarkable. Vascular/Lymphatic: Scattered aortic atherosclerotic disease. The central aspects of the aortic branch vessels are patent. No lymphadenopathy in the abdomen or pelvis. Reproductive: The prostate is present. Other: No free fluid or free air in the abdomen or pelvis. 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 No acute osseous abnormality. IMPRESSION: No acute process in the abdomen or pelvis. Electronically Signed   By: Wiliam Ke M.D.   On: 12/03/2020 03:03   DG Chest Portable 1 View  Result Date: 12/03/2020 CLINICAL DATA:  COVID-19 and shortness of breath EXAM: PORTABLE CHEST 1 VIEW COMPARISON:  None. FINDINGS: The heart size and mediastinal contours are within normal limits. Both lungs are clear. The visualized skeletal structures are unremarkable. IMPRESSION: No active disease. Electronically Signed   By: Deatra Robinson M.D.   On: 12/03/2020 01:56    ____________________________________________   PROCEDURES  Procedure(s) performed (including Critical Care):  Procedures  CRITICAL CARE Performed by: Baxter Hire Lindzie Boxx   Total critical care time: 45 minutes  Critical care time was exclusive of separately billable procedures and treating other  patients.  Critical care was necessary to treat or prevent imminent or life-threatening deterioration.  Critical care was time spent personally by me on the following activities: development of treatment plan with patient and/or surrogate as well as nursing, discussions with consultants, evaluation of patient's response to treatment, examination of patient, obtaining history from patient or surrogate, ordering and performing treatments and interventions, ordering and review of laboratory studies, ordering and review of radiographic studies, pulse oximetry and re-evaluation of patient's condition.  ____________________________________________   INITIAL IMPRESSION / ASSESSMENT AND PLAN / ED COURSE  As part of my medical decision making, I reviewed the following data within the electronic MEDICAL RECORD NUMBER Nursing notes reviewed and incorporated, Labs reviewed , EKG interpreted , Radiograph reviewed , CTs reviewed, and Notes from prior ED visits         Patient here with complaints of headache, fever, chills, shortness of breath, abdominal pain, diarrhea.  Found to be, positive for COVID-19 which I think is likely the cause of all of his symptoms today.  He has no meningismus on exam but is complaining  of severe headache.  Will give Tylenol, IV fluids and reassess.  He is very difficult to get a clear history from.  He reports he has had a stroke before.  He has never been in our hospital system.  He has no signs of any head trauma on exam and does not look like he is on any medications.  Will obtain CT of the head as intracranial hemorrhage is on the differential.  He does not appear to have any focal neurologic deficits to suggest stroke.  He also has diffuse abdominal pain on exam which could still be from COVID-19 but will obtain CT of abdomen pelvis for further evaluation.  States he has had a previous hernia repair.  I do not appreciate any hernia on exam.  Differential includes colitis,  gastroenteritis, appendicitis, cholecystitis, pancreatitis.  Labs today are reassuring other than elevated lactic.  He states he has not had anything to eat or drink in 3 days and has had lots of diarrhea.  Will give IV fluids and recheck.  Given he is otherwise hemodynamically stable and has COVID-19, will hold on broad-spectrum antibiotics.  Will reassess after fluids, medications provided.  Patient also complaining of shortness of breath however oxygen level 100% on room air.  Lungs are clear to auscultation.  Will obtain chest x-ray, EKG.  He denies any chest pain.  ED PROGRESS  3:55 AM  Patient's CT head, CT of abdomen pelvis showed no acute abnormality.  He is still complaining of pain in his head but states it is improving after Tylenol and IV fluids.  Will give Toradol.  Again I have low suspicion for meningitis.  Urinalysis pending.  We will repeat his lactic after IV fluids finished.   4:50 AM  Patient's repeat lactic is now normal after IV fluids.  I suspect this may have been elevated more due to dehydration.  Urine shows no sign of infection today.  Procalcitonin is normal.  Low suspicion for sepsis.  He remains hemodynamically stable and states he is feeling better after Toradol.  Drug screen is positive for amphetamines.  I do not see a prescription for this in the [place] controlled substance reporting system.  Patient is within the 5-day window for antivirals.  Offered him a prescription for paxlovid given he is unvaccinated.  We will send a prescription to his pharmacy.  Discussed supportive care instructions and return precautions.  I feel he is safe to be discharged home.   At this time, I do not feel there is any life-threatening condition present. I have reviewed, interpreted and discussed all results (EKG, imaging, lab, urine as appropriate) and exam findings with patient/family. I have reviewed nursing notes and appropriate previous records.  I feel the patient is safe to be  discharged home without further emergent workup and can continue workup as an outpatient as needed. Discussed usual and customary return precautions. Patient/family verbalize understanding and are comfortable with this plan.  Outpatient follow-up has been provided as needed. All questions have been answered.  ____________________________________________   FINAL CLINICAL IMPRESSION(S) / ED DIAGNOSES  Final diagnoses:  COVID-19     ED Discharge Orders          Ordered    nirmatrelvir/ritonavir EUA (PAXLOVID) 20 x 150 MG & 10 x 100MG  TABS  2 times daily        12/03/20 0456            *Please note:  Masao Junker was evaluated in Emergency Department  on 12/03/2020 for the symptoms described in the history of present illness. He was evaluated in the context of the global COVID-19 pandemic, which necessitated consideration that the patient might be at risk for infection with the SARS-CoV-2 virus that causes COVID-19. Institutional protocols and algorithms that pertain to the evaluation of patients at risk for COVID-19 are in a state of rapid change based on information released by regulatory bodies including the CDC and federal and state organizations. These policies and algorithms were followed during the patient's care in the ED.  Some ED evaluations and interventions may be delayed as a result of limited staffing during and the pandemic.*   Note:  This document was prepared using Dragon voice recognition software and may include unintentional dictation errors.    Soua Lenk, Layla Maw, DO 12/03/20 [phone]

## 2020-12-04 LAB — URINE CULTURE: Culture: NO GROWTH

## 2020-12-07 LAB — CULTURE, BLOOD (SINGLE)
Culture: NO GROWTH
Special Requests: ADEQUATE

## 2020-12-08 LAB — CULTURE, BLOOD (SINGLE)
Culture: NO GROWTH
Special Requests: ADEQUATE

## 2022-10-13 IMAGING — CT CT HEAD W/O CM
3 series · 15 of 47 positions shown, 18 images · non-contrast
Comparison: None.

CLINICAL DATA: Headache, intracranial hemorrhage suspected

EXAM:
CT HEAD WITHOUT CONTRAST
TECHNIQUE: Contiguous axial images were obtained from the base of the skull
through the vertex without intravenous contrast.

[Series 2: head wo · axial · 0.45mm/px · z∈[-117,+18]mm · 9 of 33 slices shown, 12 images]
[im 3/33  brain]
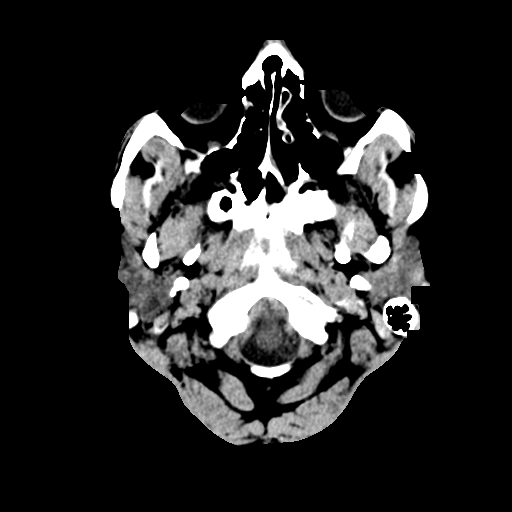
[im 3/33  bone]
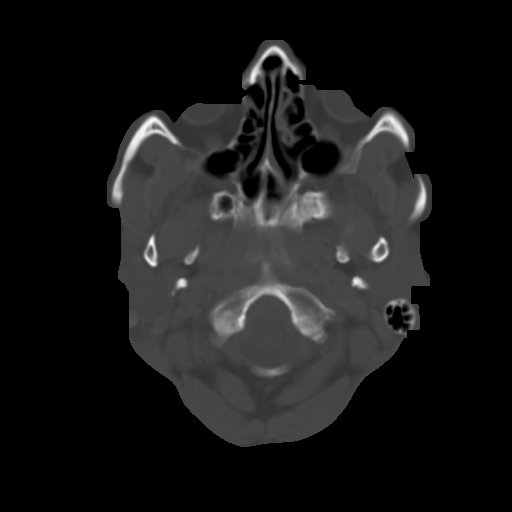
[im 6/33  brain]
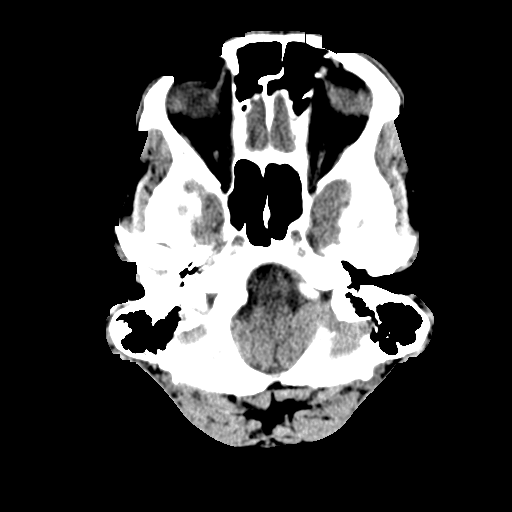
[im 9/33  brain]
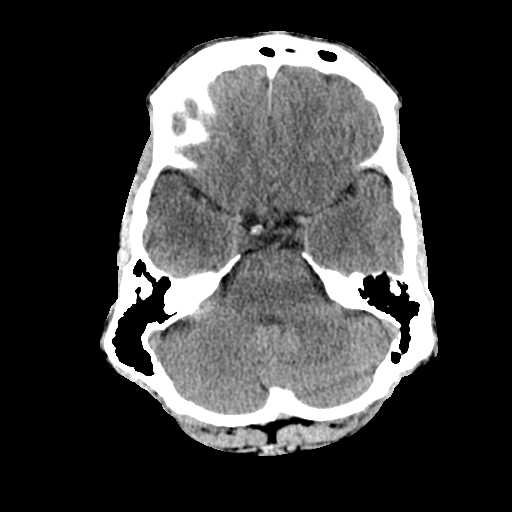
[im 13/33  brain]
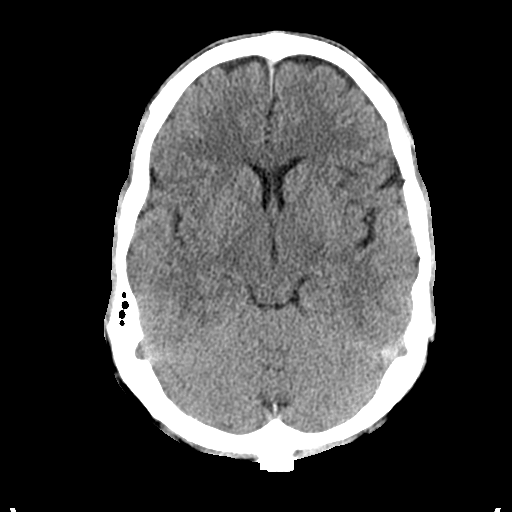
[im 17/33  brain]
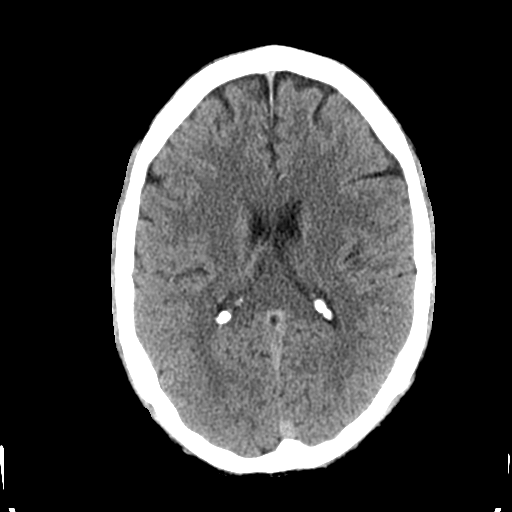
[im 17/33  bone]
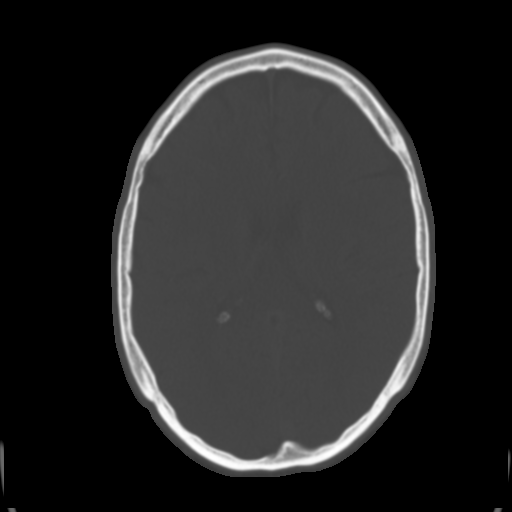
[im 20/33  brain]
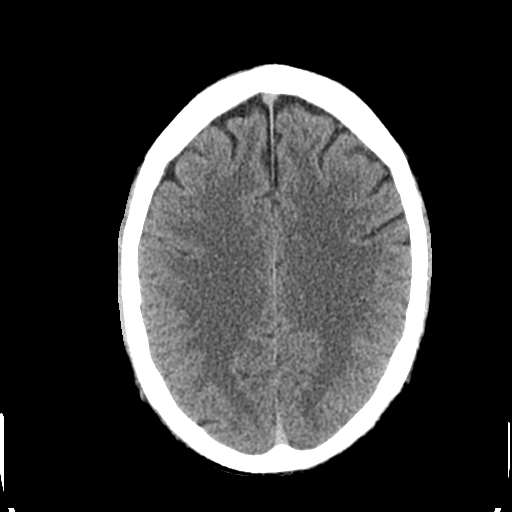
[im 24/33  brain]
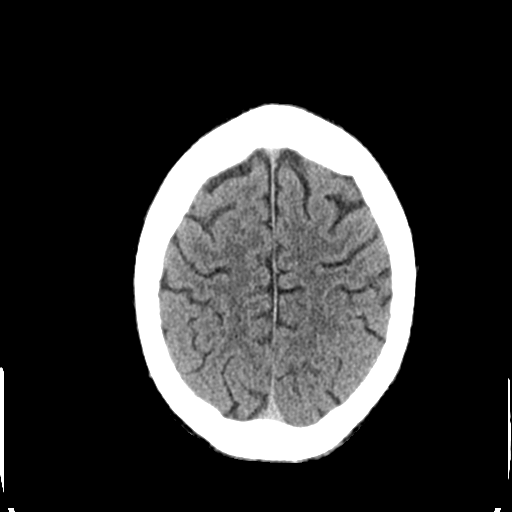
[im 27/33  brain]
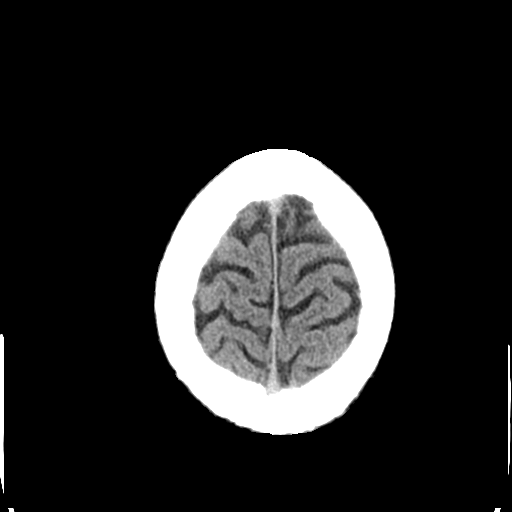
[im 30/33  brain]
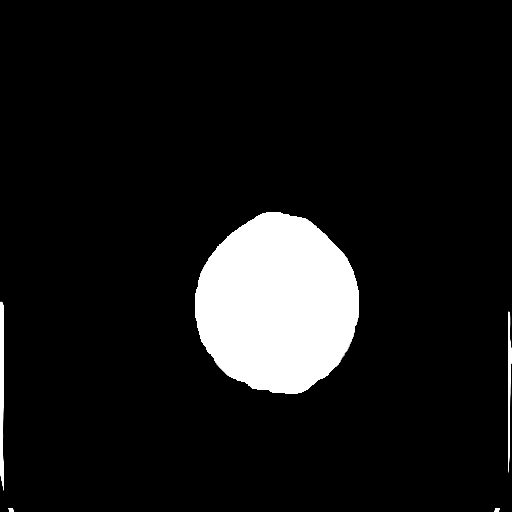
[im 30/33  bone]
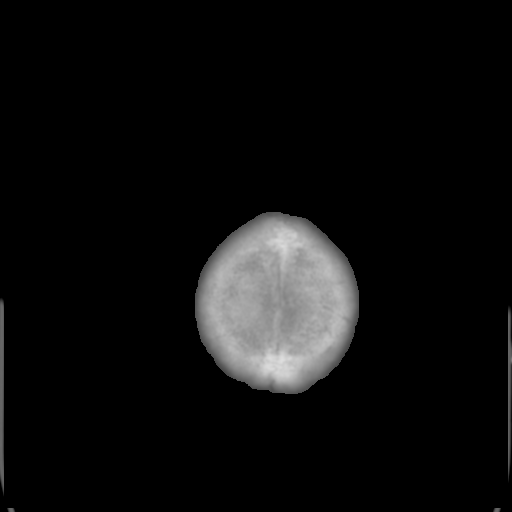

[Series 4: coronal soft tissue · coronal · 0.35mm/px · 3 of 72 slices shown]
[im 24/72  brain]
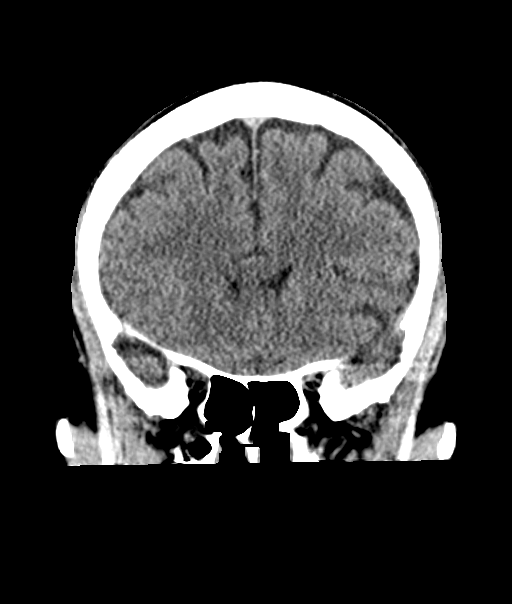
[im 32/72  brain]
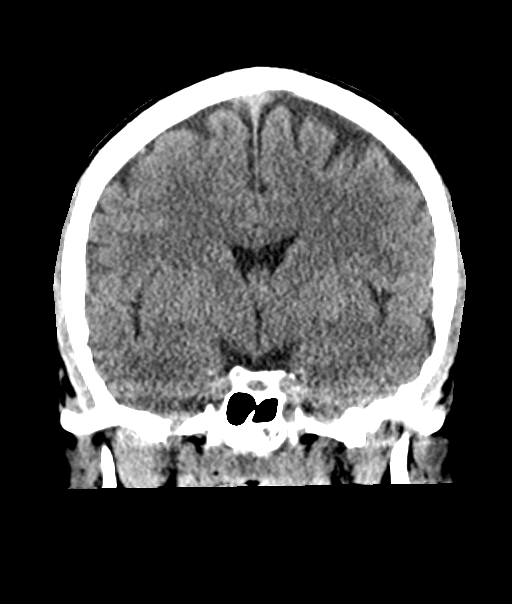
[im 40/72  brain]
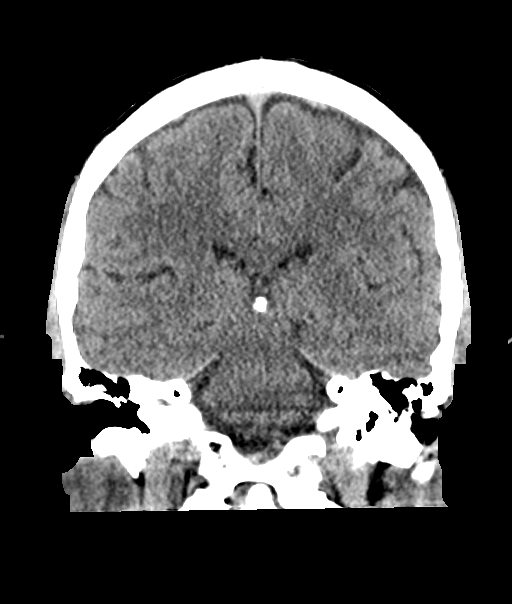

[Series 5: sagittal soft tissue · sagittal · 0.35mm/px · 3 of 57 slices shown]
[im 19/57  brain]
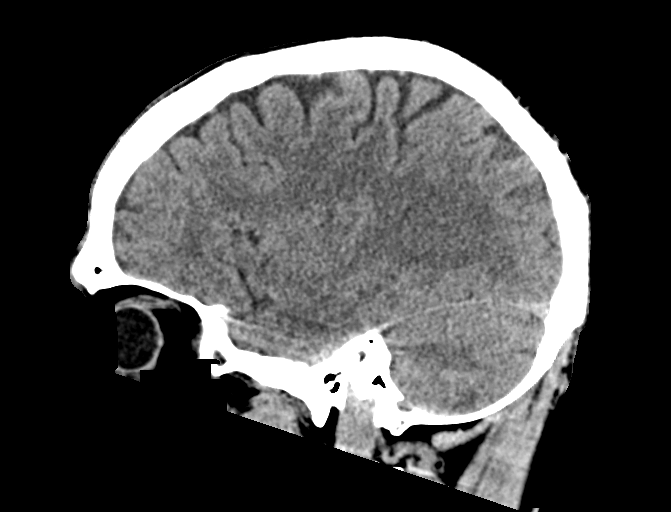
[im 29/57  brain]
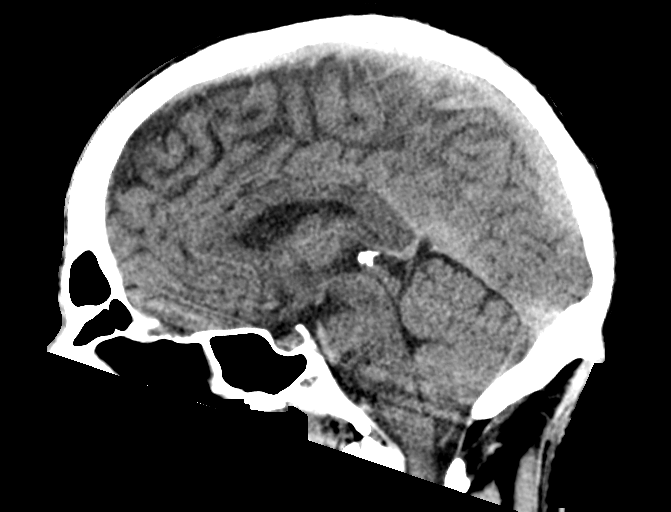
[im 38/57  brain]
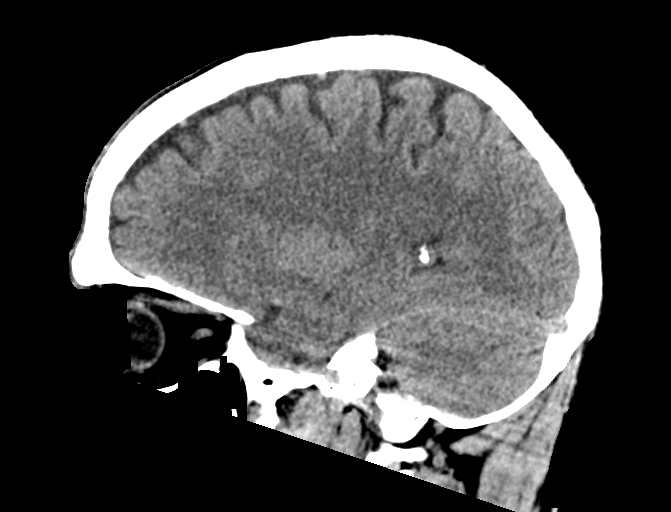

[15 of 47 positions shown; findings below may reference images not displayed]

FINDINGS: Brain: No evidence of acute infarction, hemorrhage, cerebral edema,
mass, mass effect, or midline shift. Ventricles and sulci are normal
for age. No extra-axial fluid collection.

Vascular: No hyperdense vessel or unexpected calcification.

Skull: Normal. Negative for fracture or focal lesion.

Sinuses/Orbits: Mucosal thickening in the ethmoid air cells. The
orbits are unremarkable.

Other: The mastoid air cells are well aerated.
IMPRESSION: No acute intracranial process. No etiology is seen for the patient's
headache.
